# Patient Record
Sex: Male | Born: 2000 | Race: White | Hispanic: No | Marital: Single | Smoking: Never smoker
Health system: Southern US, Community
[De-identification: ages and names within clinical notes are randomized; demographics above are authoritative.]

## PROBLEM LIST (undated history)

## (undated) DIAGNOSIS — R51 Headache: Secondary | ICD-10-CM

## (undated) DIAGNOSIS — R519 Headache, unspecified: Secondary | ICD-10-CM

## (undated) DIAGNOSIS — F3181 Bipolar II disorder: Secondary | ICD-10-CM

## (undated) DIAGNOSIS — R488 Other symbolic dysfunctions: Secondary | ICD-10-CM

## (undated) DIAGNOSIS — F909 Attention-deficit hyperactivity disorder, unspecified type: Secondary | ICD-10-CM

## (undated) DIAGNOSIS — H9325 Central auditory processing disorder: Secondary | ICD-10-CM

## (undated) DIAGNOSIS — R278 Other lack of coordination: Secondary | ICD-10-CM

## (undated) HISTORY — DX: Other symbolic dysfunctions: R48.8

## (undated) HISTORY — DX: Headache, unspecified: R51.9

## (undated) HISTORY — DX: Central auditory processing disorder: H93.25

## (undated) HISTORY — DX: Headache: R51

## (undated) HISTORY — DX: Other lack of coordination: R27.8

## (undated) HISTORY — DX: Attention-deficit hyperactivity disorder, unspecified type: F90.9

## (undated) HISTORY — PX: HERNIA REPAIR: SHX51

---
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
## 2022-01-16 IMAGING — CR DG SHOULDER 2+V*L*
3 series · 3 of 3 positions shown · non-contrast
Comparison: None.

CLINICAL DATA: Left lateral shoulder pain, fell

EXAM:
LEFT SHOULDER - 2+ VIEW

[shoulder grashey]
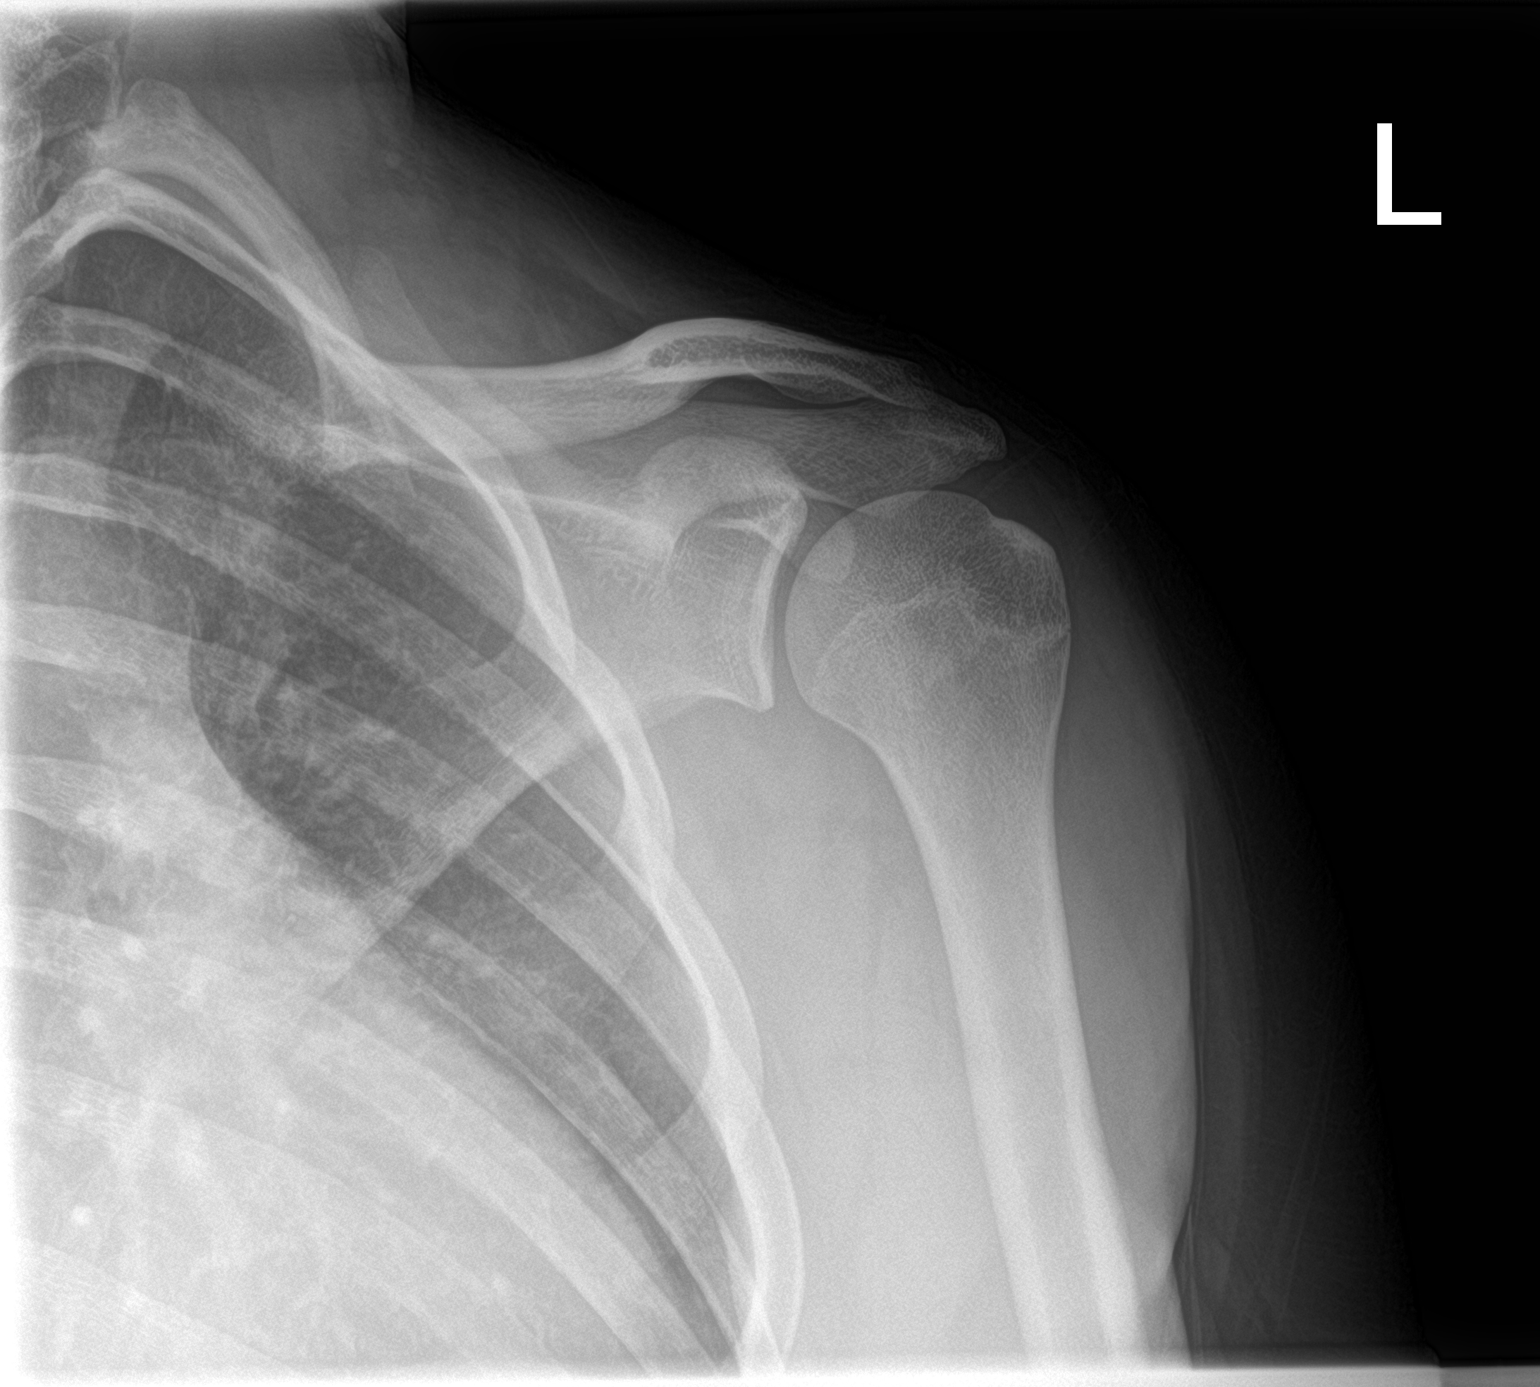

[shoulder y view]
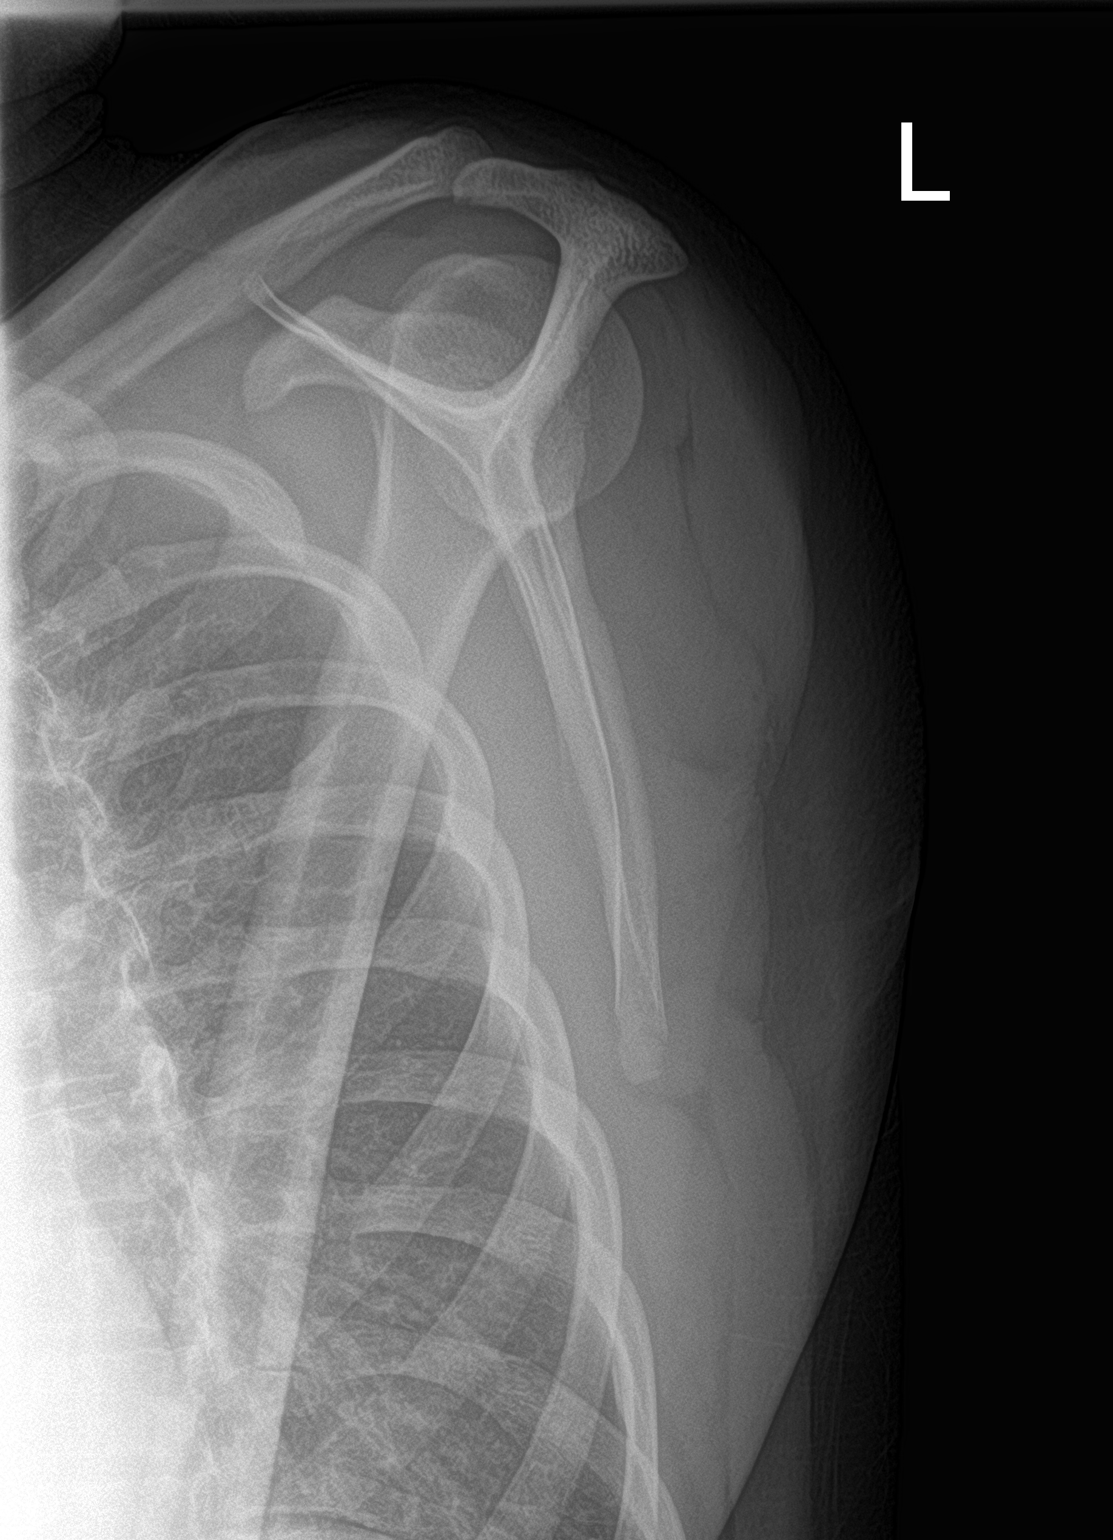

[shoulder ap neutral]
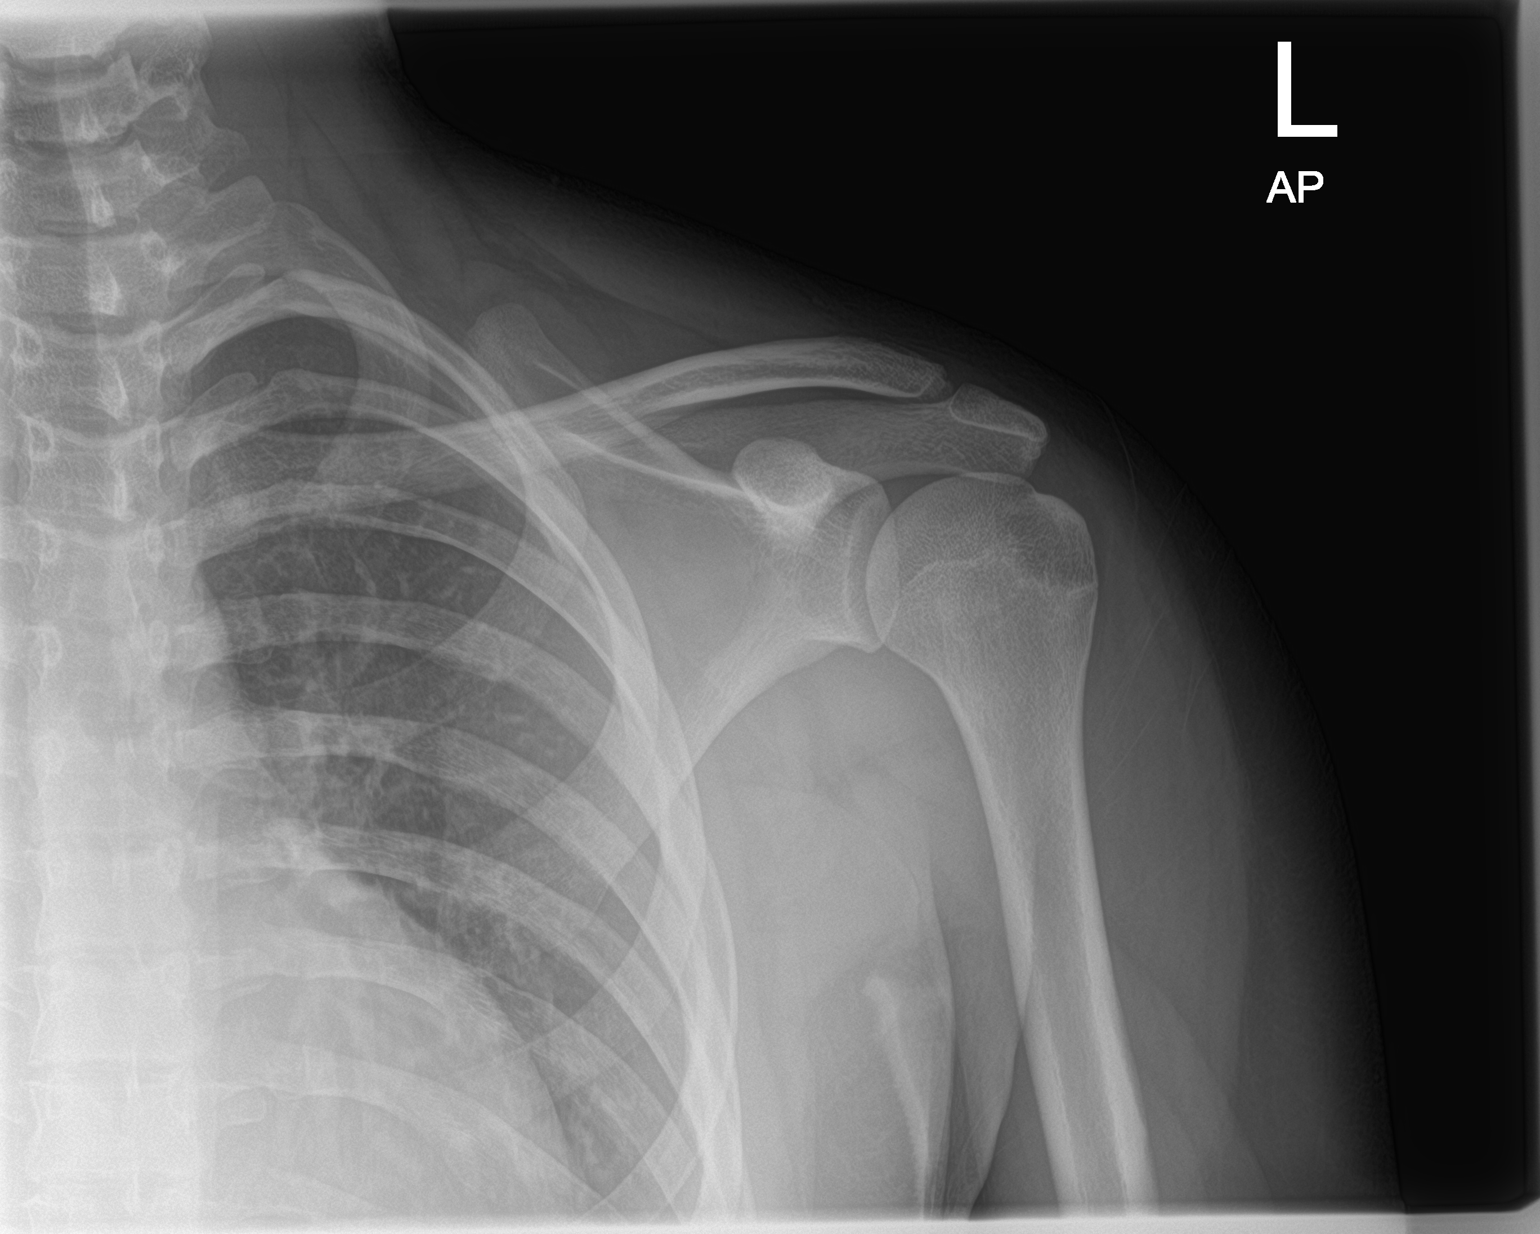

[3 of 3 positions shown; findings below may reference images not displayed]

FINDINGS: Internal rotation, external rotation, transscapular views of the
left shoulder demonstrate no fractures. Alignment is anatomic. Joint
spaces are well preserved. Left chest is clear.
IMPRESSION: 1. Unremarkable left shoulder.

## 2022-07-10 ENCOUNTER — Telehealth: Payer: Self-pay | Admitting: Family

## 2023-09-21 IMAGING — DX DG SHOULDER 2+V*R*
3 series · 3 of 3 positions shown · non-contrast
Comparison: None.

CLINICAL DATA: Fall with right shoulder injury.

EXAM:
RIGHT SHOULDER - 2+ VIEW

[shoulder grashey]
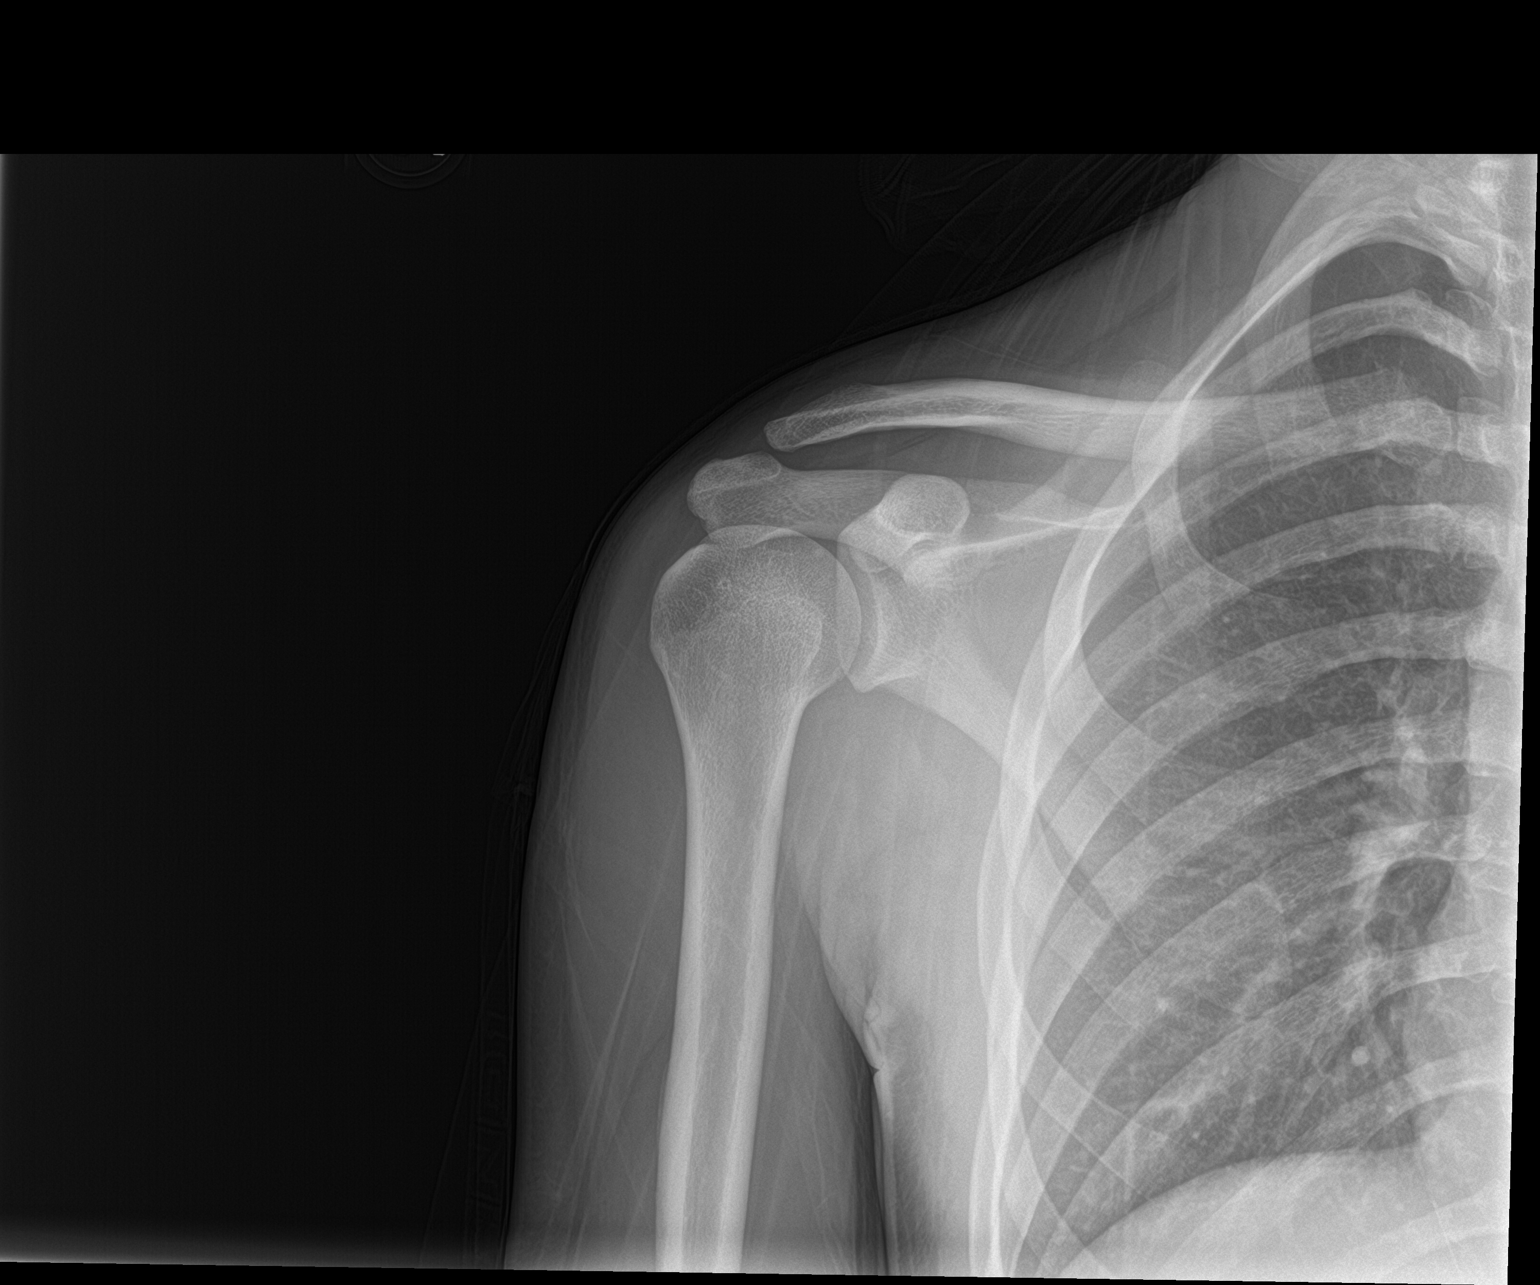

[shoulder y view]
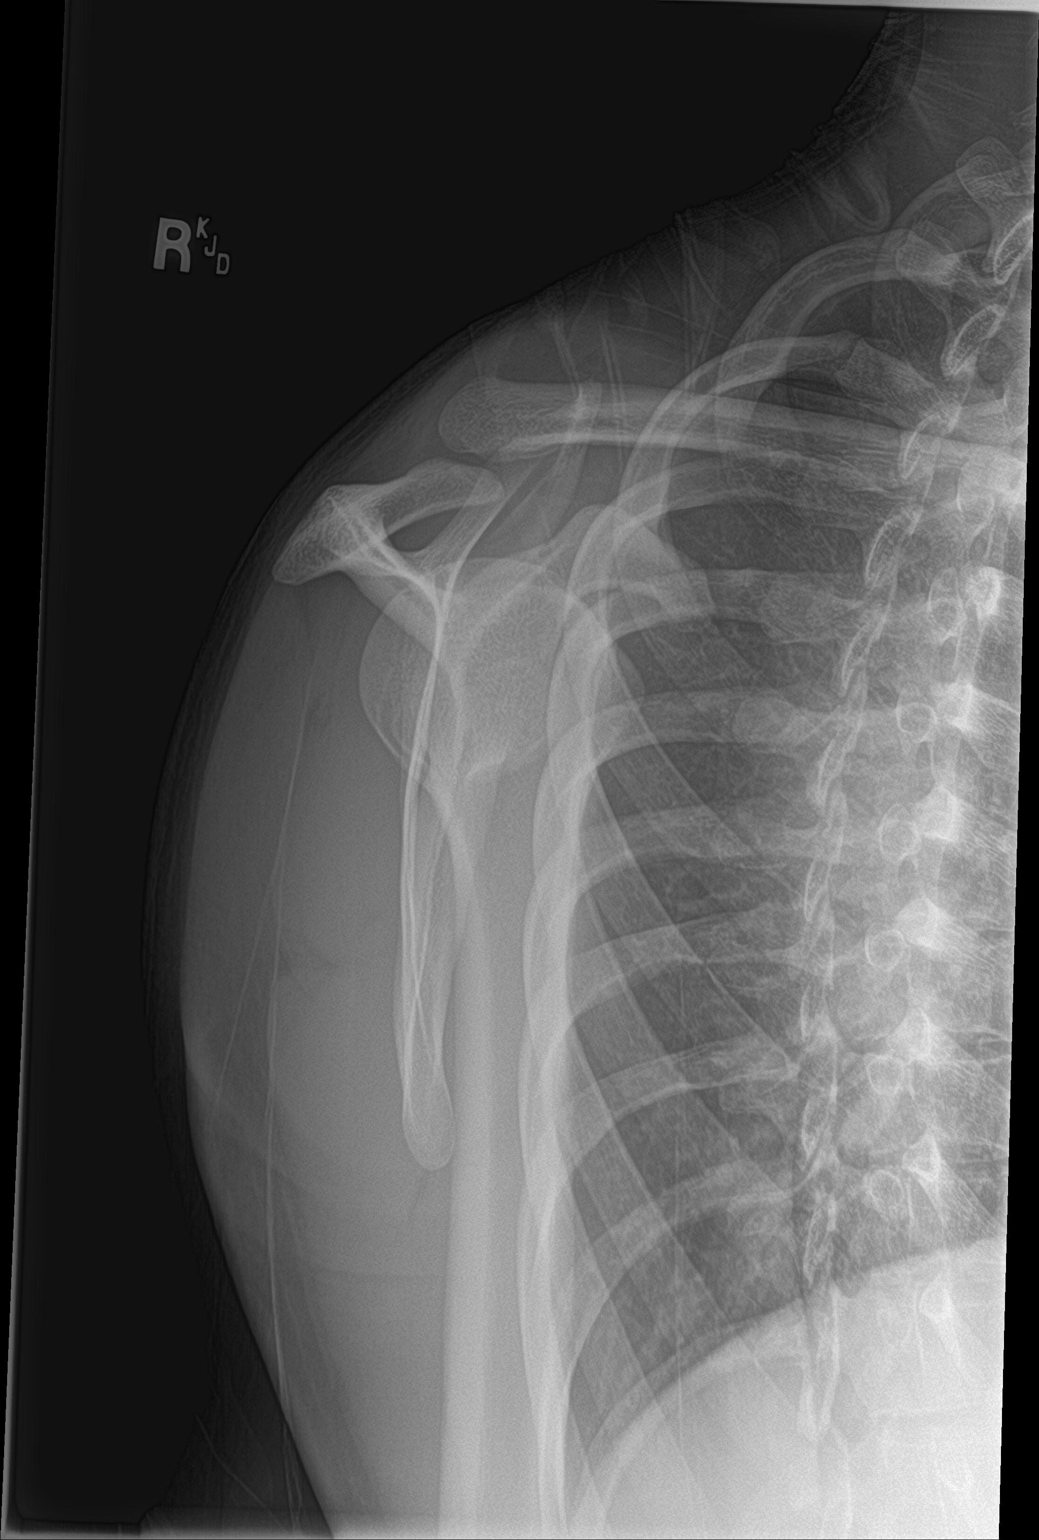

[shoulder ap neutral]
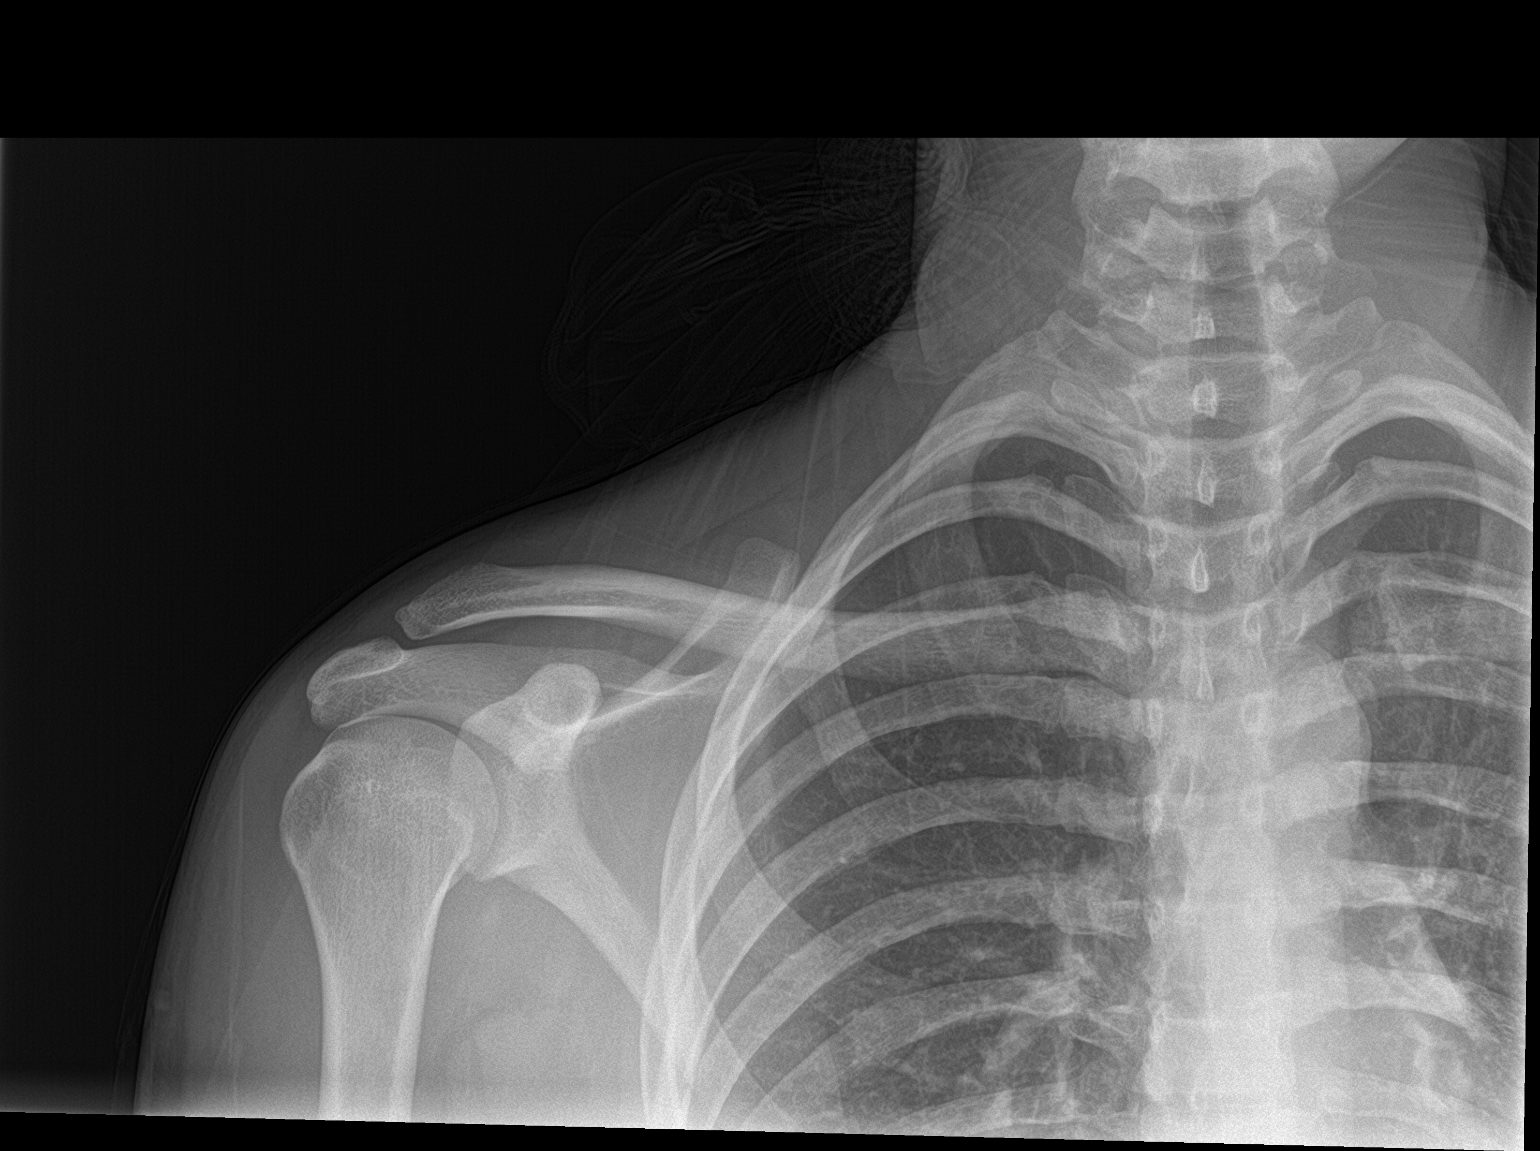

[3 of 3 positions shown; findings below may reference images not displayed]

FINDINGS: Distal clavicle is elevated to just above the level of the acromion
process consistent with 3rd degree AC separation. There is no
evidence of fracture or glenohumeral dislocation. Arthritic changes
are not seen.
IMPRESSION: Third degree AC separation.  Evidence of fractures is not seen.
